# Patient Record
Sex: Male | Born: 1995 | Race: Black or African American | Hispanic: No | Marital: Single | Smoking: Never smoker
Health system: Southern US, Community
[De-identification: ages and names within clinical notes are randomized; demographics above are authoritative.]

## PROBLEM LIST (undated history)

## (undated) DIAGNOSIS — J45909 Unspecified asthma, uncomplicated: Secondary | ICD-10-CM

---
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
## 2019-03-26 ENCOUNTER — Emergency Department (HOSPITAL_COMMUNITY)
Admission: EM | Admit: 2019-03-26 | Discharge: 2019-03-26 | Disposition: A | Discharge status: Home / Self Care | Payer: BLUE CROSS/BLUE SHIELD | Attending: Emergency Medicine | Admitting: Emergency Medicine

## 2019-03-26 ENCOUNTER — Emergency Department (HOSPITAL_COMMUNITY): Payer: BLUE CROSS/BLUE SHIELD

## 2019-03-26 ENCOUNTER — Encounter (HOSPITAL_COMMUNITY): Payer: Self-pay

## 2019-03-26 ENCOUNTER — Other Ambulatory Visit: Payer: Self-pay

## 2019-03-26 DIAGNOSIS — N50812 Left testicular pain: Secondary | ICD-10-CM | POA: Diagnosis not present

## 2019-03-26 DIAGNOSIS — J45909 Unspecified asthma, uncomplicated: Secondary | ICD-10-CM | POA: Diagnosis not present

## 2019-03-26 LAB — URINALYSIS, ROUTINE W REFLEX MICROSCOPIC
Bacteria, UA: NONE SEEN
Bilirubin Urine: NEGATIVE
Glucose, UA: NEGATIVE mg/dL
Ketones, ur: NEGATIVE mg/dL
Leukocytes,Ua: NEGATIVE
Nitrite: NEGATIVE
Protein, ur: NEGATIVE mg/dL
Specific Gravity, Urine: 1.008 (ref 1.005–1.030)
pH: 6 (ref 5.0–8.0)

## 2019-03-26 NOTE — Discharge Instructions (Signed)
Varicocele  A varicocele is a swelling of veins in the scrotum. The scrotum is the sac that contains the testicles. Varicoceles can occur on either side of the scrotum, but they are more common on the left side. They occur most often in teenage boys and young men. In most cases, varicoceles are not a serious problem. They are usually small and painless and do not require treatment. Tests may be done to confirm the diagnosis. Treatment may be needed if:  A varicocele is large, causes a lot of pain, or causes pain when exercising.  Varicoceles are found on both sides of the scrotum.  A varicocele causes a decrease in the size of the testicle in a growing adolescent.  The person has fertility problems. What are the causes? This condition is the result of valves in the veins not working properly and common on the left because of different anatomy of the veins compared to the right. Valves in the veins help to return blood from the scrotum and testicles to the heart. If these valves do not work well, blood flows backward and backs up into the veins, which causes the veins to swell. This is similar to what happens when varicose veins form in the leg. What are the signs or symptoms? Most varicoceles do not cause any symptoms. If symptoms do occur, they may include:  Swelling on one side of the scrotum. The swelling may be more obvious when you are standing up.  A lumpy feeling in the scrotum.  A heavy feeling on one side of the scrotum.  A dull ache in the scrotum, especially after exercise or prolonged standing or sitting.  Slower growth or reduced size of the testicle on the side of the varicocele (in young males).  Problems with fertility. This can occur if the testicle does not grow normally. How is this diagnosed? This condition may be diagnosed with a physical exam. You may also have an imaging test called an ultrasound to confirm the diagnosis and to help rule out other causes of the  swelling. How is this treated? Treatment is usually not needed for this condition. If you have any pain, your health care provider may prescribe or recommend medicine to help relieve it. You may need regular exams so your health care provider can monitor the varicocele to ensure that it does not cause problems. When further treatment is needed, it may involve one of these options:  Varicocelectomy. This is a surgery in which the swollen veins are tied off so that the flow of blood goes to other veins instead.  Embolization. In this procedure, a small tube (catheter) is used to place metal coils or other blocking items in the veins. This cuts off the blood flow to the swollen veins. Follow these instructions at home:  Take over-the-counter and prescription medicines only as told by your health care provider.  Wear supportive underwear.  Use an athletic supporter when participating in sports activities.  Keep all follow-up visits as told by your health care provider. This is important. Contact a health care provider if:  Your pain is increasing.  You have redness in the affected area.  Your testicle becomes enlarged, swollen, or painful.  You have swelling that does not decrease when you are lying down.  One of your testicles is smaller than the other or higher or lower than the other. Summary  Varicocele is a condition in which the veins in the scrotum are swollen or enlarged.  In most  cases, varicoceles do not require treatment.  Treatment may be needed if you have pain, have problems with infertility, or have a smaller testicle associated with the varicocele.  In some cases, the condition may be treated with a procedure to cut off the flow of blood to the swollen veins. This information is not intended to replace advice given to you by your health care provider. Make sure you discuss any questions you have with your health care provider. Document Released: 03/17/2001 Document  Revised: 09/24/2017 Document Reviewed: 09/24/2017 Elsevier Interactive Patient Education  2019 [employer].

## 2019-03-26 NOTE — ED Triage Notes (Signed)
Pt reports L testicle pain that worsened about 30 mins ago. He states that the area has been sore for the last few days, but when he rolled over earlier, the pain significantly worsened. He states that it feels twisted. Ambulatory.

## 2019-03-26 NOTE — ED Provider Notes (Signed)
[facility]-EMERGENCY DEPT Provider Note   CSN: [id number] Arrival date & time: 03/26/19  0012    History   Chief Complaint Chief Complaint  Patient presents with  . Testicle Pain    HPI Raymond Norton is a 23 y.o. male.     The history is provided by the patient and medical records.  Testicle Pain      23 year old male with history of asthma, presenting to the ED for testicle pain.  Patient reports he has had some soreness in the left testicle for a few days now, think he may have "slept on it wrong".  It initially got better but states tonight he was laying in bed about an hour PTA (~11PM) and pain got acutely worse.  He has not had any known trauma to the pelvis.  Denies urinary symptoms, fever, chills, sweats, abdominal, or flank pain.  No new sexual partner or concern for STD.  States he feels like his testicle is a little swollen now.  No meds PTA, declining meds here as well.  Past Medical History:  Diagnosis Date  . Asthma     There are no active problems to display for this patient.   History reviewed. No pertinent surgical history.      Home Medications    Prior to Admission medications   Not on File    Family History Family History  Problem Relation Age of Onset  . Hypertension Mother     Social History Social History   Tobacco Use  . Smoking status: Never Smoker  Substance Use Topics  . Alcohol use: No  . Drug use: No     Allergies   Patient has no known allergies.   Review of Systems Review of Systems  Genitourinary: Positive for testicular pain.  All other systems reviewed and are negative.    Physical Exam Updated Vital Signs BP (!) 134/104 (BP Location: Left Arm)   Pulse (!) 111   Temp 98.4 F (36.9 C) (Oral)   Resp 18   SpO2 100%   Physical Exam Vitals signs and nursing note reviewed.  Constitutional:      Appearance: He is well-developed.  HENT:     Head: Normocephalic and atraumatic.   Eyes:     Conjunctiva/sclera: Conjunctivae normal.     Pupils: Pupils are equal, round, and reactive to light.  Neck:     Musculoskeletal: Normal range of motion.  Cardiovascular:     Rate and Rhythm: Normal rate and regular rhythm.     Heart sounds: Normal heart sounds.  Pulmonary:     Effort: Pulmonary effort is normal.     Breath sounds: Normal breath sounds.  Abdominal:     General: Bowel sounds are normal.     Palpations: Abdomen is soft.  Genitourinary:    Penis: Normal.      Scrotum/Testes:        Left: Tenderness present.     Comments: Left testicle is tender to the touch, mild swelling noted compared with left; corn does appear enlarged on left; normal lie; no overlying skin changes; penis normal Musculoskeletal: Normal range of motion.  Skin:    General: Skin is warm and dry.  Neurological:     Mental Status: He is alert and oriented to person, place, and time.      ED Treatments / Results  Labs (all labs ordered are listed, but only abnormal results are displayed) Labs Reviewed  URINALYSIS, ROUTINE W REFLEX MICROSCOPIC  GC/CHLAMYDIA  PROBE AMP ([place]) NOT AT [facility]    EKG None  Radiology US Scrotum W/doppler  Result Date: 03/26/2019 CLINICAL DATA:  Left testicular pain x1 hour EXAM: SCROTAL ULTRASOUND DOPPLER ULTRASOUND OF THE TESTICLES TECHNIQUE: Complete ultrasound examination of the testicles, epididymis, and other scrotal structures was performed. Color and spectral Doppler ultrasound were also utilized to evaluate blood flow to the testicles. COMPARISON:  None. FINDINGS: Right testicle Measurements: 4.3 x 2.2 x 2.6 cm. No mass or microlithiasis visualized. Left testicle Measurements: 4.5 x 2.1 x 2.6 cm. No mass or microlithiasis visualized. Right epididymis:  Normal in size and appearance. Left epididymis:  10 x 12 x 8 mm epididymal head cyst. Hydrocele:  None visualized. 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  Pulsed Doppler interrogation of both testes demonstrates normal low resistance arterial and venous waveforms bilaterally. IMPRESSION: Normal sonographic appearance of the bilateral testes. 12 mm left epididymal head cyst. No evidence of testicular torsion. 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 No secondary findings involving the left testis to suggest torsion/detorsion, although this is difficult to exclude in the appropriate clinical setting. Electronically Signed   By: Charline Bills M.D.   On: 03/26/2019 01:31    Procedures Procedures (including critical care time)  Medications Ordered in ED Medications - No data to display   Initial Impression / Assessment and Plan / ED Course  I have reviewed the triage vital signs and the nursing notes.  Pertinent labs & imaging results that were available during my care of the patient were reviewed by me and considered in my medical decision making (see chart for details).  22 year old male presenting to the ED with left testicle pain.  States it has been "sore" for a few days now, started to get better but got worse again about 1 hour prior to arrival (around 11PM).  He denies any known trauma to the groin.  He denies any current urinary symptoms, penile discharge, abdominal pain, flank pain, fever, or chills.  On exam left testicle is very tender to palpation and mildly swollen, normal lie.  Veins do appear enlarged, no masses appreciated.  Plan for urinalysis, gc/chl, and scrotal US with doppler.  UA without signs of infection.  Gc/chl pending.  [place] with twisting of the left vascular cord, however no other acute findings of torsion but difficult to fully exclude.  Discussed with urology, they will evaluate in the ED.  2:54 AM Urology, Dr. Mena Goes, has evaluated in ED-- feels this is likely just varicocele.  [place] reviewed with good doppler flow and not felt to represent torsion at this time.  Can follow-up in the clinic  as needed.  Very appreciative of input/consult.  Patient acknowledged understanding.  Can return here for any new/acute changes.  Final Clinical Impressions(s) / ED Diagnoses   Final diagnoses:  Pain in left testicle    ED Discharge Orders    None       Garlon Hatchet, PA-C 03/26/19 0331    Paula Libra, MD 03/26/19 [phone]

## 2019-03-26 NOTE — Consult Note (Signed)
Consultation: Left scrotal pain, rule out torsion Requested by: Dr. Paula Libra  History of Present Illness: [name] is a 23 year old male is had some left testicular discomfort over the past few days.  He thought it was worse tonight.  In talking with him he rolled over to go to sleep and he noticed it more.  It was not severe pain when questioned but he was more concerned because the noted something on the left cord above the testicle. There is no fever or swelling.  He has no prior urologic history.  Per the patient his left testicle normally lies lower than the right and this has not changed.  He is voiding normally without dysuria.  He underwent scrotal ultrasound which showed good Doppler flow to both testicles but described "left varicocele with mild twisting of the left vascular pedicle/cord".  I reviewed all the images.  There was no testicular mass.   He's studying Dance movement psychotherapist at [employer]   Modifying factors: There are no other modifying factors  Associated signs and symptoms: There are no other associated signs and symptoms Aggravating and relieving factors: There are no other aggravating or relieving factors Severity: Moderate Duration: Persistent  Past Medical History:  Diagnosis Date  . Asthma    History reviewed. No pertinent surgical history.  Home Medications:  (Not in a hospital admission)  Allergies: No Known Allergies  Family History  Problem Relation Age of Onset  . Hypertension Mother    Social History:  reports that he has never smoked. He does not have any smokeless tobacco history on file. He reports that he does not drink alcohol or use drugs.  ROS: A complete review of systems was performed.  All systems are negative except for pertinent findings as noted. Review of Systems  All other systems reviewed and are negative.    Physical Exam:  Vital signs in last 24 hours: Temp:  [98.4 F (36.9 C)] 98.4 F (36.9 C) (04/03 0018) Pulse  Rate:  [111] 111 (04/03 0018) Resp:  [18] 18 (04/03 0018) BP: (134)/(104) 134/104 (04/03 0018) SpO2:  [100 %] 100 % (04/03 0018) General:  Alert and oriented, No acute distress HEENT: Normocephalic, atraumatic Cardiovascular: Regular rate and rhythm Lungs: Regular rate and effort Abdomen: Soft, nontender, nondistended, no abdominal masses Back: No CVA tenderness Extremities: No edema Neurologic: Grossly intact GU: The penis is circumcised and without mass or lesion.  The scrotum and testicles appear normal.  There is absolutely no erythema or swelling.  The lie of both testicles is normal with the left testicle hanging a bit lower than the right.  He is a thin young man and easy to examine the cord and the scrotum is warm and loose which makes the testicle exam easy and reliable. On the left there is a visible wormlike varicosity that is more visible and more palpable after he coughs and strains which is in a more focal spot on the left anterior cord mid scrotum. It would be considered a grade II-III. Above this the cord is palpably normal without any abnormality or concern for twisting. There is minimal pain on my exam.  He tolerated the exam very well.  I examined him both lying and standing.  The testicles are palpably normal.  The epididymides are palpably normal. Urine in the urinal is clear yellow.   Laboratory Data:  Results for orders placed or performed during the hospital encounter of 03/26/19 (from the past 24 hour(s))  Urinalysis, Routine w  reflex microscopic     Status: Abnormal   Collection Time: 03/26/19  1:49 AM  Result Value Ref Range   Color, Urine STRAW (A) YELLOW   APPearance CLEAR CLEAR   Specific Gravity, Urine 1.008 1.005 - 1.030   pH 6.0 5.0 - 8.0   Glucose, UA NEGATIVE NEGATIVE mg/dL   Hgb urine dipstick SMALL (A) NEGATIVE   Bilirubin Urine NEGATIVE NEGATIVE   Ketones, ur NEGATIVE NEGATIVE mg/dL   Protein, ur NEGATIVE NEGATIVE mg/dL   Nitrite NEGATIVE NEGATIVE    Leukocytes,Ua NEGATIVE NEGATIVE   RBC / HPF 0-5 0 - 5 RBC/hpf   WBC, UA 0-5 0 - 5 WBC/hpf   Bacteria, UA NONE SEEN NONE SEEN   Mucus PRESENT    No results found for this or any previous visit (from the past 240 hour(s)). Creatinine: No results for input(s): CREATININE in the last 168 hours.  Impression/Assessment:  Left scrotal pain, left varicocele-  Plan:  I discussed the exam and ultrasound findings with the patient.  The history and exam are fairly classic for a left varicocele and I would not be concerned about torsion at all on my exam.  Correlating that with the ultrasound he has good Doppler signal and he was reassured.  We went over the anatomy and the nature of varicoceles.  I told him to return to emergency department or he can be seen in our office if it is during office hours if he has any increasing pain in the testicle, redness, swelling or one testicle is much higher or lower than the other.  Especially of the left were higher than the right.  All questions answered and he expressed understanding.  Otherwise he should utilize anti-inflammatories, scrotal support and ice as needed.  Discussed with PA Allyne Gee and Dr. Read Drivers.   Jerilee Field 03/26/2019, 2:57 AM

## 2020-09-05 IMAGING — US ULTRASOUND SCROTUM DOPPLER COMPLETE
1 series · 13 of 25 positions shown · non-contrast
Comparison: None.

CLINICAL DATA: Left testicular pain x1 hour

EXAM:
SCROTAL ULTRASOUND
DOPPLER ULTRASOUND OF THE TESTICLES
TECHNIQUE: Complete ultrasound examination of the testicles, epididymis, and
other scrotal structures was performed. Color and spectral Doppler
ultrasound were also utilized to evaluate blood flow to the
testicles.

[Series 1: ultrasound scrotum doppler complete · 44 acquisitions, 13 frames shown]
[im 1/44]
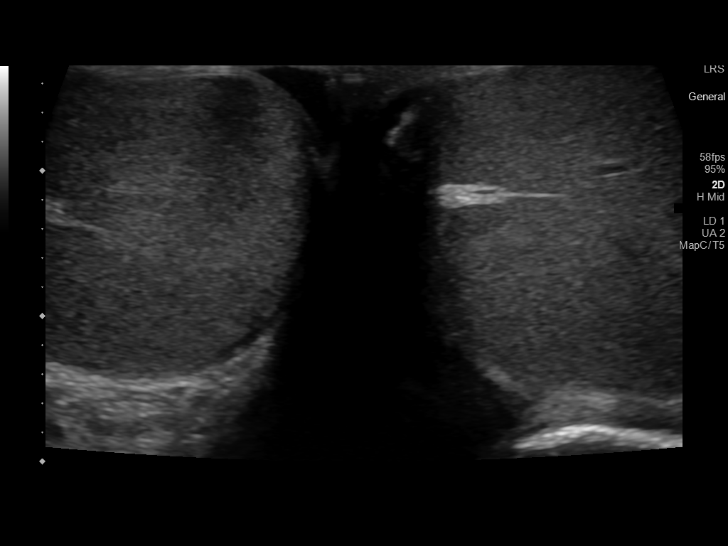
[im 4/44]
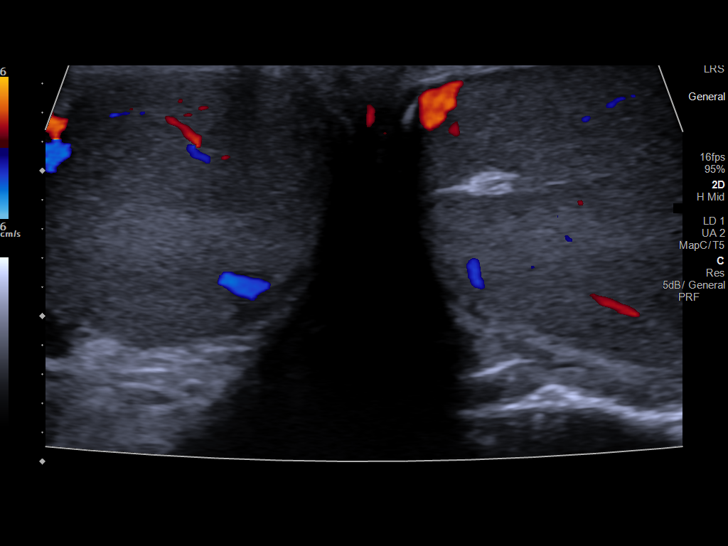
[im 8/44]
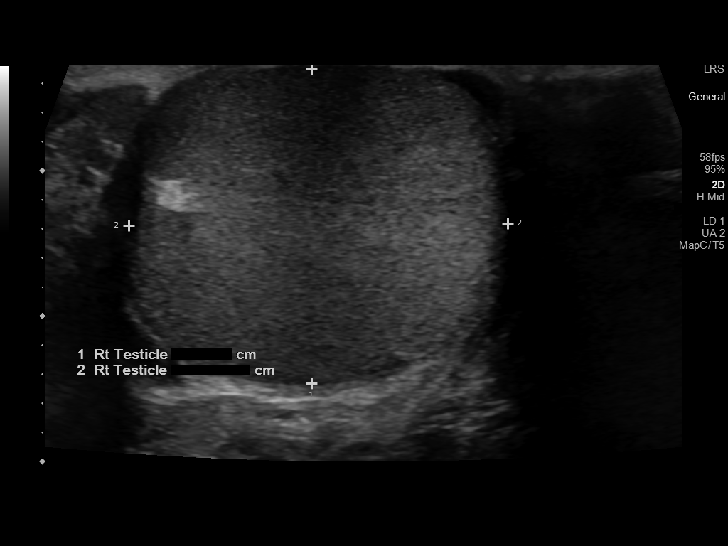
[im 11/44]
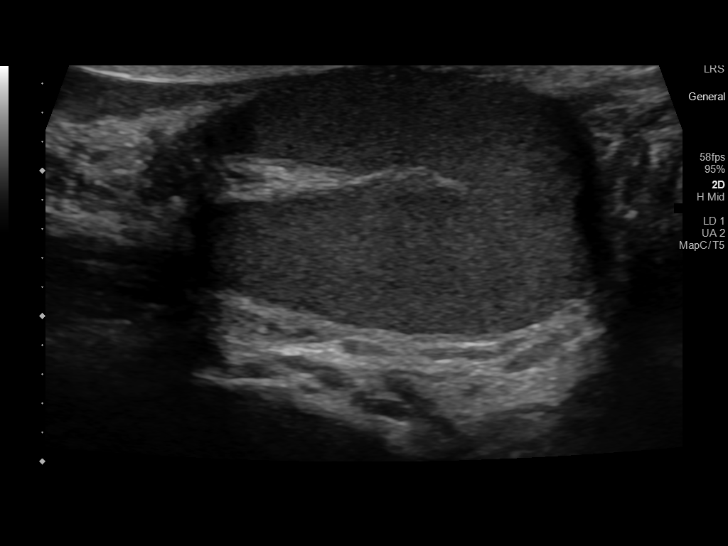
[im 15/44]
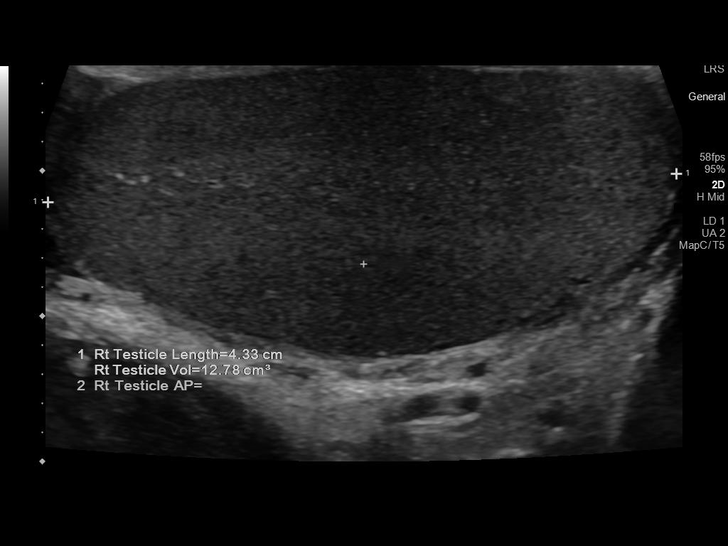
[im 18/44]
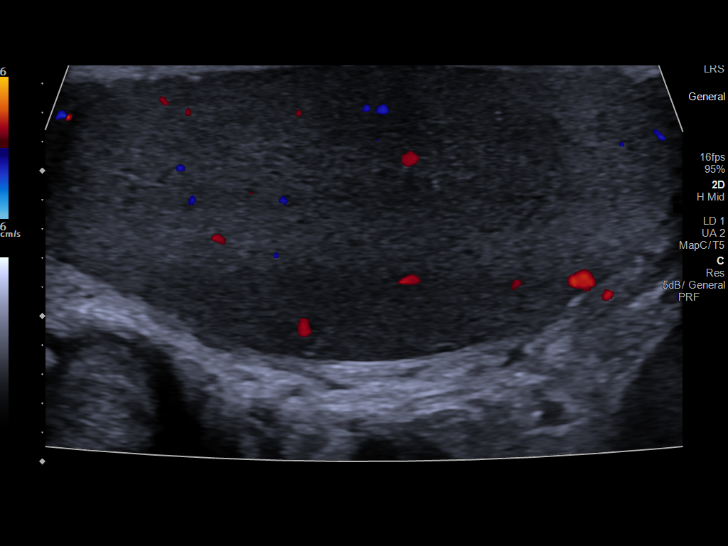
[im 22/44]
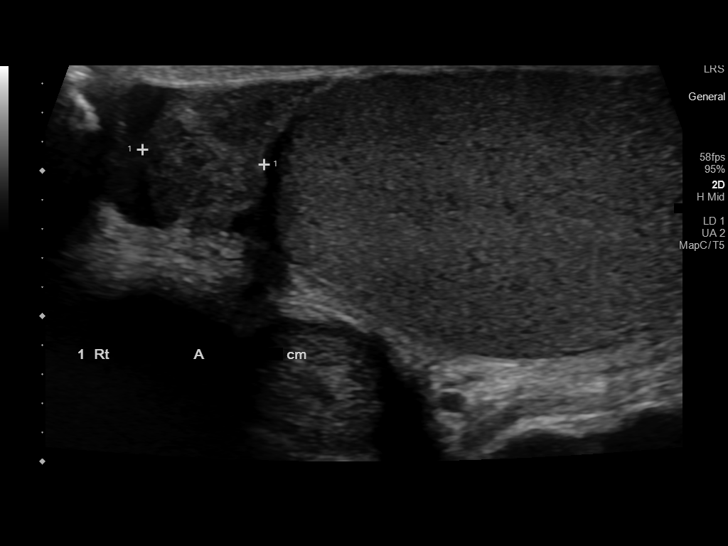
[im 26/44]
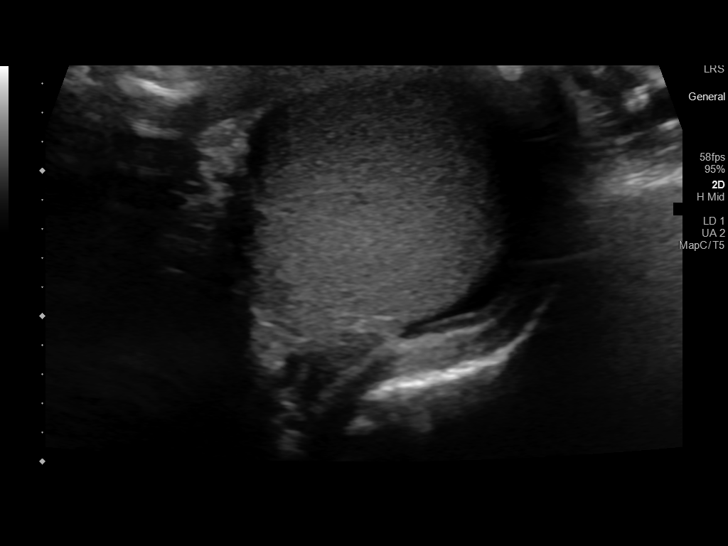
[im 29/44]
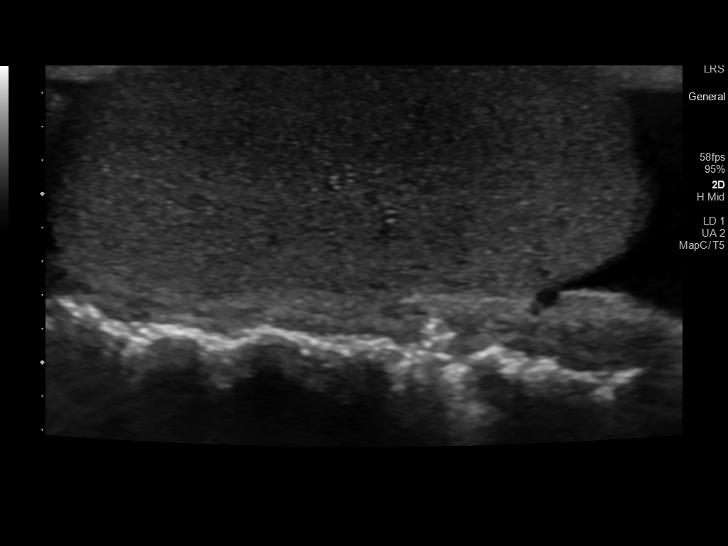
[im 33/44]
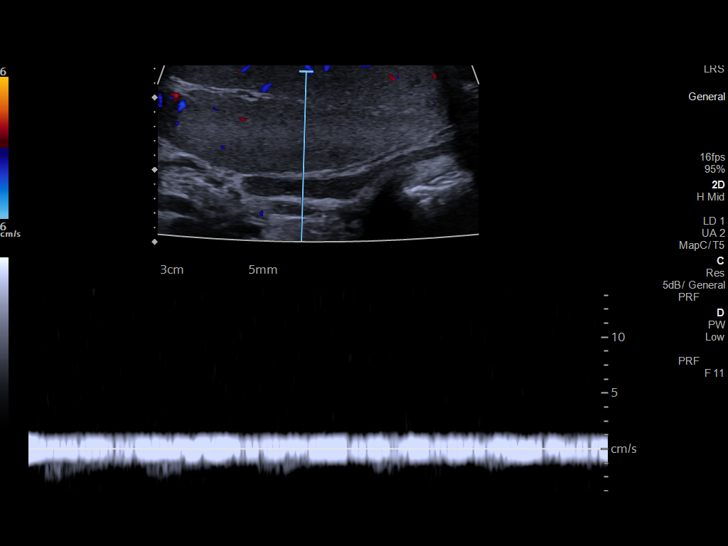
[im 36/44]
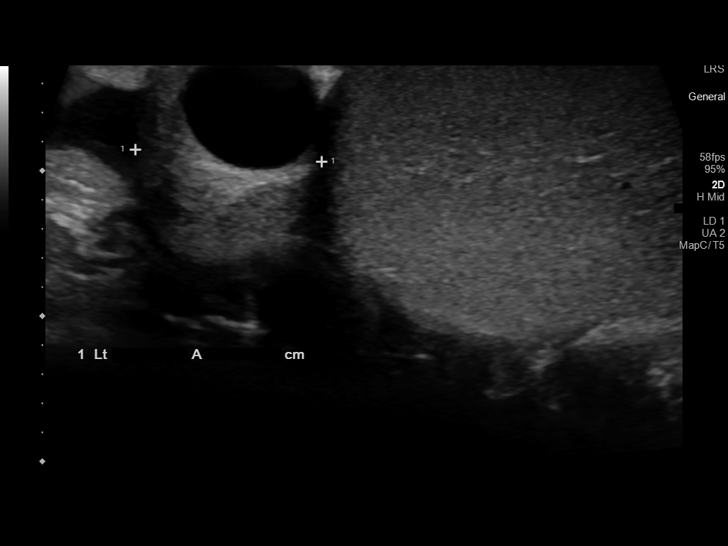
[im 40/44]
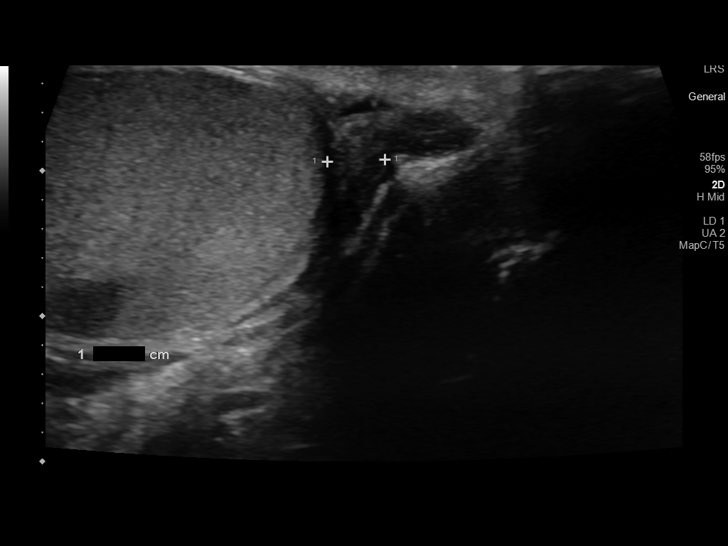
[im 44/44]
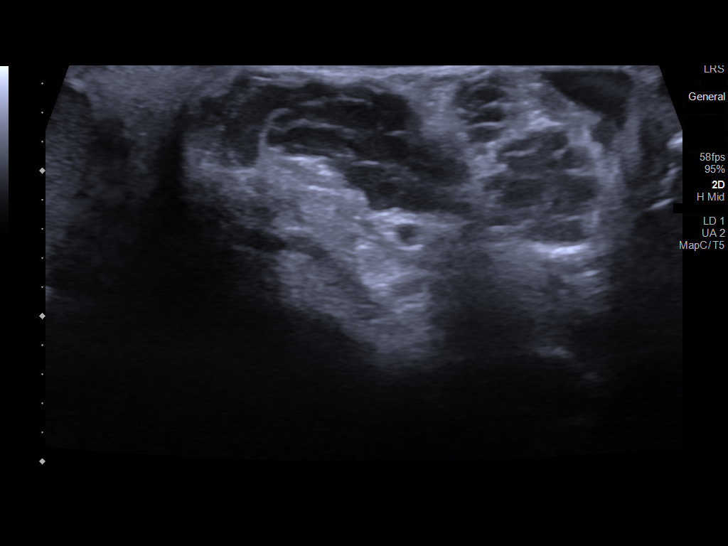

[13 of 25 positions shown; findings below may reference images not displayed]

FINDINGS: Right testicle

Measurements: 4.3 x 2.2 x 2.6 cm. No mass or microlithiasis
visualized.

Left testicle

Measurements: 4.5 x 2.1 x 2.6 cm. No mass or microlithiasis
visualized.

Right epididymis:  Normal in size and appearance.

Left epididymis:  10 x 12 x 8 mm epididymal head cyst.

Hydrocele:  None visualized.

Varicocele: Left varicocele with mild twisting of the left vascular
pedicle/cord.

Pulsed Doppler interrogation of both testes demonstrates normal low
resistance arterial and venous waveforms bilaterally.
IMPRESSION: Normal sonographic appearance of the bilateral testes.

12 mm left epididymal head cyst.

No evidence of testicular torsion.

Left varicocele with mild twisting of the left vascular
pedicle/cord. No secondary findings involving the left testis to
suggest torsion/detorsion, although this is difficult to exclude in
the appropriate clinical setting.
